# Patient Record
Sex: Male | Born: 1995 | Race: White | Hispanic: No | Marital: Married | Smoking: Current every day smoker
Health system: Southern US, Community
[De-identification: ages and names within clinical notes are randomized; demographics above are authoritative.]

---
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
## 2017-12-07 ENCOUNTER — Emergency Department
Admission: EM | Admit: 2017-12-07 | Discharge: 2017-12-07 | Disposition: A | Discharge status: Home / Self Care | Payer: Self-pay | Attending: Emergency Medicine | Admitting: Emergency Medicine

## 2017-12-07 ENCOUNTER — Other Ambulatory Visit: Payer: Self-pay

## 2017-12-07 DIAGNOSIS — F17210 Nicotine dependence, cigarettes, uncomplicated: Secondary | ICD-10-CM | POA: Insufficient documentation

## 2017-12-07 DIAGNOSIS — H6122 Impacted cerumen, left ear: Secondary | ICD-10-CM | POA: Insufficient documentation

## 2017-12-07 MED ORDER — CARBAMIDE PEROXIDE 6.5 % OT SOLN: 5.0000 [drp] | Freq: Two times a day (BID) | OTIC | 0 refills | Status: DC

## 2017-12-07 NOTE — ED Notes (Signed)
See triage note  Presents with possible ear wax impaction in left ear  States he tried to clean out ear w/o success

## 2017-12-07 NOTE — ED Triage Notes (Signed)
Pt c/o loss of hearing like there is a lot of wax in it since yesterday. Denies any pain.

## 2017-12-07 NOTE — Discharge Instructions (Signed)
Use eardrops as directed for 3 to 5 days.

## 2017-12-07 NOTE — ED Provider Notes (Signed)
[facility] Emergency Department Provider Note   ____________________________________________   First MD Initiated Contact with Patient 12/07/17 [phone]     (approximate)  I have reviewed the triage vital signs and the nursing notes.   HISTORY  Chief Complaint Cerumen Impaction    HPI Phillip Bryant is a 22 y.o. male patient presents with left ear.  Onset of complaint was yesterday.  Patient denies URI signs and symptoms.  Patient denies vertigo.  Patient states he attempted to irrigate the ear using hydroperoxide.  Patient is a solution burn to his ears.  Patient requesting noninvasive procedure.  History reviewed. No pertinent past medical history.  There are no active problems to display for this patient.   History reviewed. No pertinent surgical history.  Prior to Admission medications   Medication Sig Start Date End Date Taking? Authorizing Provider  carbamide peroxide (DEBROX) 6.5 % OTIC solution Place 5 drops into the left ear 2 (two) times daily. 12/07/17   Joni ReiningSmith, Gilman Olazabal K, PA-C    Allergies Patient has no known allergies.  No family history on file.  Social History Social History   Tobacco Use  . Smoking status: Current Every Day Smoker    Types: Cigarettes  . Smokeless tobacco: Never Used  Substance Use Topics  . Alcohol use: Yes  . Drug use: Not on file    Review of Systems Constitutional: No fever/chills Eyes: No visual changes. ENT: No sore throat.  Mild hearing loss left ear Cardiovascular: Denies chest pain. Respiratory: Denies shortness of breath. Gastrointestinal: No abdominal pain.  No nausea, no vomiting.  No diarrhea.  No constipation. Genitourinary: Negative for dysuria. Musculoskeletal: Negative for back pain. Skin: Negative for rash. Neurological: Negative for headaches, focal weakness or numbness.   ____________________________________________   PHYSICAL EXAM:  VITAL SIGNS: ED Triage Vitals  Enc Vitals  Group     BP 12/07/17 0836 (!) 158/85     Pulse Rate 12/07/17 0835 75     Resp 12/07/17 0835 16     Temp 12/07/17 0835 97.6 F (36.4 C)     Temp Source 12/07/17 0835 Oral     SpO2 12/07/17 0835 99 %     Weight 12/07/17 0835 214 lb (97.1 kg)     Height 12/07/17 0835 6\' 5"  (1.956 m)     Head Circumference --      Peak Flow --      Pain Score 12/07/17 0835 0     Pain Loc --      Pain Edu? --      Excl. in GC? --    Constitutional: Alert and oriented. Well appearing and in no acute distress. EAR: Mild soft cerumen distal left ear canal. Cardiovascular: Normal rate, regular rhythm. Grossly normal heart sounds.  Good peripheral circulation. Respiratory: Normal respiratory effort.  No retractions. Lungs CTAB. Skin:  Skin is warm, dry and intact. No rash noted. Psychiatric: Mood and affect are normal. Speech and behavior are normal.  ____________________________________________   LABS (all labs ordered are listed, but only abnormal results are displayed)  Labs Reviewed - No data to display ____________________________________________  EKG   ____________________________________________  RADIOLOGY  ED MD interpretation:    Official radiology report(s): No results found.  ____________________________________________   PROCEDURES  Procedure(s) performed: None  Procedures  Critical Care performed: No  ____________________________________________   INITIAL IMPRESSION / ASSESSMENT AND PLAN / ED COURSE  As part of my medical decision making, I reviewed the following data within the  electronic MEDICAL RECORD NUMBER    Mild hearing loss secondary to cerumen impaction.  Patient given discharge care instruction and advised use eardrops as directed.  Follow-up as needed.      ____________________________________________   FINAL CLINICAL IMPRESSION(S) / ED DIAGNOSES  Final diagnoses:  Cerumen debris on tympanic membrane of left ear     ED Discharge Orders         Ordered    carbamide peroxide (DEBROX) 6.5 % OTIC solution  2 times daily     12/07/17 0913       Note:  This document was prepared using Dragon voice recognition software and may include unintentional dictation errors.    Joni Reining, PA-C 12/07/17 1610    Merrily Brittle, MD 12/07/17 [phone]

## 2018-08-07 ENCOUNTER — Other Ambulatory Visit: Payer: Self-pay

## 2018-08-07 ENCOUNTER — Encounter: Payer: Self-pay | Admitting: Emergency Medicine

## 2018-08-07 ENCOUNTER — Emergency Department
Admission: EM | Admit: 2018-08-07 | Discharge: 2018-08-07 | Disposition: A | Discharge status: Home / Self Care | Payer: Self-pay | Attending: Emergency Medicine | Admitting: Emergency Medicine

## 2018-08-07 DIAGNOSIS — F17210 Nicotine dependence, cigarettes, uncomplicated: Secondary | ICD-10-CM | POA: Insufficient documentation

## 2018-08-07 DIAGNOSIS — J029 Acute pharyngitis, unspecified: Secondary | ICD-10-CM | POA: Insufficient documentation

## 2018-08-07 DIAGNOSIS — R590 Localized enlarged lymph nodes: Secondary | ICD-10-CM | POA: Insufficient documentation

## 2018-08-07 LAB — GROUP A STREP BY PCR: Group A Strep by PCR: NOT DETECTED

## 2018-08-07 NOTE — ED Triage Notes (Signed)
Pt presents to ED via POV with c/o sore throat and "swollen lymphnodes" x 3 days, reports taking tylenol and naproxen without relief. Pt on phone in triage, visualized in NAD.

## 2018-08-07 NOTE — Discharge Instructions (Signed)
Please drink plenty liquid to stay well-hydrated.  You may continue to take Tylenol, as needed for your symptoms.  Today, you did not meet criteria for testing for coronavirus.  Coronavirus can cause sore throat.  Please follow the home isolation guidelines as printed below.  Return to the emergency department if you develop severe pain, lightheadedness or fainting, shortness of breath, inability to keep down fluids, fever, or any other symptoms concerning to you.     Phillip Bryant: Phillip Bryant  Location: [address] [place] [address]   Infection Prevention Recommendations for Individuals Confirmed to have, or Being Evaluated for, 2019 Novel Coronavirus (COVID-19) Infection Who Receive Care at Home  Individuals who are confirmed to have, or are being evaluated for, COVID-19 should follow the prevention steps below until a healthcare provider or local or state health department says they can return to normal activities.  Stay home except to get medical care You should restrict activities outside your home, except for getting medical care. Do not go to work, school, or public areas, and do not use public transportation or taxis.  Call ahead before visiting your doctor Before your medical appointment, call the healthcare provider and tell them that you have, or are being evaluated for, COVID-19 infection. This will help the healthcare providers office take steps to keep other people from getting infected. Ask your healthcare provider to call the local or state health department.  Monitor your symptoms Seek prompt medical attention if your illness is worsening (e.g., difficulty breathing). Before going to your medical appointment, call the healthcare provider and tell them that you have, or are being evaluated for, COVID-19 infection. Ask your healthcare provider to call the local or state health department.  Wear a facemask You should wear a facemask that covers  your nose and mouth when you are in the same room with other people and when you visit a healthcare provider. People who live with or visit you should also wear a facemask while they are in the same room with you.  Separate yourself from other people in your home As much as possible, you should stay in a different room from other people in your home. Also, you should use a separate bathroom, if available.  Avoid sharing household items You should not share dishes, drinking glasses, cups, eating utensils, towels, bedding, or other items with other people in your home. After using these items, you should wash them thoroughly with soap and water.  Cover your coughs and sneezes Cover your mouth and nose with a tissue when you cough or sneeze, or you can cough or sneeze into your sleeve. Throw used tissues in a lined trash can, and immediately wash your hands with soap and water for at least 20 seconds or use an alcohol-based hand rub.  Wash your [employer] your hands often and thoroughly with soap and water for at least 20 seconds. You can use an alcohol-based hand sanitizer if soap and water are not available and if your hands are not visibly dirty. Avoid touching your eyes, nose, and mouth with unwashed hands.   Prevention Steps for Caregivers and Household Members of Individuals Confirmed to have, or Being Evaluated for, COVID-19 Infection Being Cared for in the Home  If you live with, or provide care at home for, a Phillip confirmed to have, or being evaluated for, COVID-19 infection please follow these guidelines to prevent infection:  Follow healthcare providers instructions Make sure that you understand and can  help the patient follow any healthcare provider instructions for all care.  Provide for the patients basic needs You should help the patient with basic needs in the home and provide support for getting groceries, prescriptions, and other personal needs.  Monitor the  patients symptoms If they are getting sicker, call his or her medical provider and tell them that the patient has, or is being evaluated for, COVID-19 infection. This will help the healthcare providers office take steps to keep other people from getting infected. Ask the healthcare provider to call the local or state health department.  Limit the number of people who have contact with the patient If possible, have only one caregiver for the patient. Other household members should stay in another home or place of residence. If this is not possible, they should stay in another room, or be separated from the patient as much as possible. Use a separate bathroom, if available. Restrict visitors who do not have an essential need to be in the home.  Keep older adults, very young children, and other sick people away from the patient Keep older adults, very young children, and those who have compromised immune systems or chronic health conditions away from the patient. This includes people with chronic heart, lung, or kidney conditions, diabetes, and cancer.  Ensure good ventilation Make sure that shared spaces in the home have good air flow, such as from an air conditioner or an opened window, weather permitting.  Wash your hands often Wash your hands often and thoroughly with soap and water for at least 20 seconds. You can use an alcohol based hand sanitizer if soap and water are not available and if your hands are not visibly dirty. Avoid touching your eyes, nose, and mouth with unwashed hands. Use disposable paper towels to dry your hands. If not available, use dedicated cloth towels and replace them when they become wet.  Wear a facemask and gloves Wear a disposable facemask at all times in the room and gloves when you touch or have contact with the patients blood, body fluids, and/or secretions or excretions, such as sweat, saliva, sputum, nasal mucus, vomit, urine, or feces.  Ensure the mask  fits over your nose and mouth tightly, and do not touch it during use. Throw out disposable facemasks and gloves after using them. Do not reuse. Wash your hands immediately after removing your facemask and gloves. If your personal clothing becomes contaminated, carefully remove clothing and launder. Wash your hands after handling contaminated clothing. Place all used disposable facemasks, gloves, and other waste in a lined container before disposing them with other household waste. Remove gloves and wash your hands immediately after handling these items.  Do not share dishes, glasses, or other household items with the patient Avoid sharing household items. You should not share dishes, drinking glasses, cups, eating utensils, towels, bedding, or other items with a patient who is confirmed to have, or being evaluated for, COVID-19 infection. After the Phillip uses these items, you should wash them thoroughly with soap and water.  Wash laundry thoroughly Immediately remove and wash clothes or bedding that have blood, body fluids, and/or secretions or excretions, such as sweat, saliva, sputum, nasal mucus, vomit, urine, or feces, on them. Wear gloves when handling laundry from the patient. Read and follow directions on labels of laundry or clothing items and detergent. In general, wash and dry with the warmest temperatures recommended on the label.  Clean all areas the individual has used often Clean all touchable  surfaces, such as counters, tabletops, doorknobs, bathroom fixtures, toilets, phones, keyboards, tablets, and bedside tables, every day. Also, clean any surfaces that may have blood, body fluids, and/or secretions or excretions on them. Wear gloves when cleaning surfaces the patient has come in contact with. Use a diluted bleach solution (e.g., dilute bleach with 1 part bleach and 10 parts water) or a household disinfectant with a label that says EPA-registered for coronaviruses. To make a  bleach solution at home, add 1 tablespoon of bleach to 1 quart (4 cups) of water. For a larger supply, add  cup of bleach to 1 gallon (16 cups) of water. Read labels of cleaning products and follow recommendations provided on product labels. Labels contain instructions for safe and effective use of the cleaning product including precautions you should take when applying the product, such as wearing gloves or eye protection and making sure you have good ventilation during use of the product. Remove gloves and wash hands immediately after cleaning.  Monitor yourself for signs and symptoms of illness Caregivers and household members are considered close contacts, should monitor their health, and will be asked to limit movement outside of the home to the extent possible. Follow the monitoring steps for close contacts listed on the symptom monitoring form.   ? If you have additional questions, contact your local health department or call the epidemiologist on call at [phone] (available 24/7). ? This guidance is subject to change. For the most up-to-date guidance from [facility], please refer to their website: [email or web address]

## 2018-08-07 NOTE — ED Provider Notes (Addendum)
[facility] Emergency Department Provider Note  ____________________________________________  Time seen: Approximately 12:35 PM  I have reviewed the triage vital signs and the nursing notes.   HISTORY  Chief Complaint Sore Throat    HPI Phillip Bryant is a 23 y.o. male, otherwise healthy, presenting with 3 days of sore throat and swollen lymph nodes below the mandible.  The patient denies any congestion or rhinorrhea, ear pain, cough, shortness of breath, fevers or chills.  He has not had any sick contacts.  He states he has been staying alone.  He has tried Tylenol and naproxen for his pain without significant improvement.   History reviewed. No pertinent past medical history.  There are no active problems to display for this patient.   History reviewed. No pertinent surgical history.  Current Outpatient Rx  . Order #: [id number] Class: Normal    Allergies Patient has no known allergies.  History reviewed. No pertinent family history.  Social History Social History   Tobacco Use  . Smoking status: Current Every Day Smoker    Types: Cigarettes  . Smokeless tobacco: Never Used  Substance Use Topics  . Alcohol use: Yes  . Drug use: Not on file    Review of Systems Constitutional: No fever/chills.  Lightheadedness or syncope.  No general malaise.  No myalgias. Eyes: No visual changes.  No eye discharge. ENT: Positive sore throat. No congestion or rhinorrhea.  Positive lymphadenopathy Cardiovascular: Denies chest pain. Denies palpitations. Respiratory: Denies shortness of breath.  No cough. Gastrointestinal: No abdominal pain.  No nausea, no vomiting.  No diarrhea.  No constipation. Genitourinary: Negative for dysuria. Musculoskeletal: Negative for back pain. Skin: Negative for rash. Neurological: Negative for headaches. No focal numbness, tingling or weakness.     ____________________________________________   PHYSICAL EXAM:  VITAL  SIGNS: ED Triage Vitals  Enc Vitals Group     BP 08/07/18 1146 (!) 158/90     Pulse Rate 08/07/18 1146 80     Resp 08/07/18 1146 18     Temp 08/07/18 1146 98.3 F (36.8 C)     Temp Source 08/07/18 1146 Oral     SpO2 08/07/18 1146 100 %     Weight 08/07/18 1146 198 lb (89.8 kg)     Height 08/07/18 1146 6\' 5"  (1.956 m)     Head Circumference --      Peak Flow --      Pain Score 08/07/18 1205 2     Pain Loc --      Pain Edu? --      Excl. in GC? --     Constitutional: Alert and oriented. Answers questions appropriately.  Patient is playing a game on his phone when I come into the room and I have to ask him to put it away. Eyes: Conjunctivae are normal.  EOMI. No scleral icterus.  No eye discharge. Head: Atraumatic. Nose: No congestion/rhinnorhea. Mouth/Throat: Mucous membranes are moist.  No posterior pharyngeal erythema, tonsillar swelling or exudate.  The posterior palate is symmetric and the uvula is midline.  No hoarse voice, drooling, trismus or stridor.  Positive submandibular lymphadenopathy that is symmetric and nontender bilaterally. Neck: No stridor.  Supple.  No meningismus. Cardiovascular: Normal rate, regular rhythm. No murmurs, rubs or gallops.  Respiratory: Normal respiratory effort.  No accessory muscle use or retractions. Lungs CTAB.  No wheezes, rales or ronchi. Musculoskeletal: Moves all extremities well. Neurologic:  A&Ox3.  Speech is clear.  Face and smile are symmetric.  EOMI.  Moves all extremities well. Skin:  Skin is warm, dry and intact. No rash noted. Psychiatric: Mood and affect are normal. Speech and behavior are normal.  Normal judgement.  ____________________________________________   LABS (all labs ordered are listed, but only abnormal results are displayed)  Labs Reviewed  GROUP A STREP BY PCR   ____________________________________________  EKG  Not indicated ____________________________________________  RADIOLOGY  No results  found.  ____________________________________________   PROCEDURES  Procedure(s) performed: None  Procedures  Critical Care performed: No ____________________________________________   INITIAL IMPRESSION / ASSESSMENT AND PLAN / ED COURSE  Pertinent labs & imaging results that were available during my care of the patient were reviewed by me and considered in my medical decision making (see chart for details).  23 y.o., otherwise healthy, presenting with sore throat and lymphadenopathy for 3 days.  Overall, the patient is hemodynamically stable and afebrile.  The most likely etiology for the patient's symptoms is a viral infection.  We will also do a strep test.  I do not see any signs or symptoms of life-threatening infection today.  Mononucleosis is considered but less likely without any history of fatigue.  I have given him extensive precautions about self-isolation for 14 days given his URI symptoms.  Anticipate discharge.  Phillip Bryant was evaluated in Emergency Department on 08/07/2018 for the symptoms described in the history of present illness. He was evaluated in the context of the global COVID-19 pandemic, which necessitated consideration that the patient might be at risk for infection with the SARS-CoV-2 virus that causes COVID-19. Institutional protocols and algorithms that pertain to the evaluation of patients at risk for COVID-19 are in a state of rapid change based on information released by regulatory bodies including the CDC and federal and state organizations. These policies and algorithms were followed during the patient's care in the ED.  ----------------------------------------- 1:27 PM on 08/07/2018 -----------------------------------------  Strep testing is negative.  The patient be discharged at this time.  ____________________________________________  FINAL CLINICAL IMPRESSION(S) / ED DIAGNOSES  Final diagnoses:  Pharyngitis, unspecified etiology  Submandibular  lymphadenopathy         NEW MEDICATIONS STARTED DURING THIS VISIT:  New Prescriptions   No medications on file      Rockne Menghini, MD 08/07/18 1240    Rockne Menghini, MD 08/07/18 1328

## 2019-07-17 ENCOUNTER — Encounter: Payer: Self-pay | Admitting: Physician Assistant

## 2019-07-17 ENCOUNTER — Emergency Department
Admission: EM | Admit: 2019-07-17 | Discharge: 2019-07-17 | Disposition: A | Discharge status: Home / Self Care | Payer: Self-pay | Attending: Emergency Medicine | Admitting: Emergency Medicine

## 2019-07-17 ENCOUNTER — Emergency Department: Payer: Self-pay

## 2019-07-17 DIAGNOSIS — S82124A Nondisplaced fracture of lateral condyle of right tibia, initial encounter for closed fracture: Secondary | ICD-10-CM | POA: Insufficient documentation

## 2019-07-17 DIAGNOSIS — S82141A Displaced bicondylar fracture of right tibia, initial encounter for closed fracture: Secondary | ICD-10-CM

## 2019-07-17 DIAGNOSIS — Y999 Unspecified external cause status: Secondary | ICD-10-CM | POA: Insufficient documentation

## 2019-07-17 DIAGNOSIS — F17210 Nicotine dependence, cigarettes, uncomplicated: Secondary | ICD-10-CM | POA: Insufficient documentation

## 2019-07-17 DIAGNOSIS — S82831A Other fracture of upper and lower end of right fibula, initial encounter for closed fracture: Secondary | ICD-10-CM | POA: Insufficient documentation

## 2019-07-17 DIAGNOSIS — Y929 Unspecified place or not applicable: Secondary | ICD-10-CM | POA: Insufficient documentation

## 2019-07-17 DIAGNOSIS — Y9389 Activity, other specified: Secondary | ICD-10-CM | POA: Insufficient documentation

## 2019-07-17 MED ORDER — OXYCODONE-ACETAMINOPHEN 5-325 MG PO TABS
1.0000 | ORAL_TABLET | Freq: Once | ORAL | Status: AC
  Administered 2019-07-17: 1 via ORAL
  Filled 2019-07-17: qty 1

## 2019-07-17 MED ORDER — OXYCODONE-ACETAMINOPHEN 5-325 MG PO TABS: 1.0000 | ORAL_TABLET | ORAL | 0 refills | Status: AC | PRN
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
## 2019-07-17 NOTE — Discharge Instructions (Signed)
Follow-up with [facility] clinic orthopedics.  Please call for appointment.  Tell them we talked with Dr.Poggi, he wants to see you in the office.  Take the pain medication as needed.  You may also take ibuprofen or Aleve.  Keep ice on the right knee. DO NOT BEAR WEIGHT ON THE RIGHT LEG

## 2021-02-16 ENCOUNTER — Other Ambulatory Visit: Payer: Self-pay

## 2021-02-16 ENCOUNTER — Ambulatory Visit
Admission: EM | Admit: 2021-02-16 | Discharge: 2021-02-16 | Disposition: A | Discharge status: Home / Self Care | Payer: BC Managed Care – PPO | Attending: Emergency Medicine | Admitting: Emergency Medicine

## 2021-02-16 ENCOUNTER — Ambulatory Visit (INDEPENDENT_AMBULATORY_CARE_PROVIDER_SITE_OTHER): Payer: BC Managed Care – PPO

## 2021-02-16 DIAGNOSIS — S60221A Contusion of right hand, initial encounter: Secondary | ICD-10-CM | POA: Diagnosis not present

## 2021-02-16 DIAGNOSIS — M79641 Pain in right hand: Secondary | ICD-10-CM | POA: Diagnosis not present

## 2021-02-16 NOTE — Discharge Instructions (Signed)
Your x-rays did not reveal the presence of any broken bones.  As we discussed, it is possible to rupture of the joint capsule which would cause your localized swelling and increased pressure when you work your hand.  Rest your right hand is much as possible and also keep it elevated to decrease swelling.  You can take over-the-counter ibuprofen according to the package instructions as needed for pain and swelling.  You can apply ice to your hand for  20 minutes at a time 2-3 times a day.  Do not apply the ice directly to your skin as this can cause skin damage.  If you develop any increased in swelling, redness, heat from your knuckle, red streaks going up your hand, or fever please return for reevaluation or go to the emergency department.
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
